# Patient Record
Sex: Female | Born: 2000 | Hispanic: Yes | Marital: Single | Smoking: Never smoker
Health system: Southern US, Community
[De-identification: ages and names within clinical notes are randomized; demographics above are authoritative.]

## PROBLEM LIST (undated history)

## (undated) DIAGNOSIS — M419 Scoliosis, unspecified: Secondary | ICD-10-CM

## (undated) DIAGNOSIS — L03317 Cellulitis of buttock: Secondary | ICD-10-CM

## (undated) DIAGNOSIS — O24419 Gestational diabetes mellitus in pregnancy, unspecified control: Secondary | ICD-10-CM

## (undated) DIAGNOSIS — B20 Human immunodeficiency virus [HIV] disease: Secondary | ICD-10-CM

## (undated) DIAGNOSIS — Z8614 Personal history of Methicillin resistant Staphylococcus aureus infection: Secondary | ICD-10-CM

## (undated) DIAGNOSIS — F419 Anxiety disorder, unspecified: Secondary | ICD-10-CM

## (undated) DIAGNOSIS — Z21 Asymptomatic human immunodeficiency virus [HIV] infection status: Secondary | ICD-10-CM

## (undated) DIAGNOSIS — D249 Benign neoplasm of unspecified breast: Secondary | ICD-10-CM

## (undated) DIAGNOSIS — F32A Depression, unspecified: Secondary | ICD-10-CM

## (undated) DIAGNOSIS — L0231 Cutaneous abscess of buttock: Secondary | ICD-10-CM

## (undated) DIAGNOSIS — L02419 Cutaneous abscess of limb, unspecified: Secondary | ICD-10-CM

## (undated) DIAGNOSIS — L0211 Cutaneous abscess of neck: Secondary | ICD-10-CM

## (undated) HISTORY — DX: Depression, unspecified: F32.A

## (undated) HISTORY — DX: Cellulitis of buttock: L02.31

## (undated) HISTORY — DX: Cutaneous abscess of buttock: L02.31

## (undated) HISTORY — DX: Cutaneous abscess of limb, unspecified: L02.419

## (undated) HISTORY — DX: Personal history of Methicillin resistant Staphylococcus aureus infection: Z86.14

## (undated) HISTORY — DX: Asymptomatic human immunodeficiency virus (hiv) infection status: Z21

## (undated) HISTORY — DX: Cutaneous abscess of neck: L02.11

## (undated) HISTORY — PX: INCISION AND DRAINAGE ABSCESS: SHX5864

## (undated) HISTORY — DX: Scoliosis, unspecified: M41.9

## (undated) HISTORY — DX: Human immunodeficiency virus (HIV) disease: B20

## (undated) HISTORY — DX: Gestational diabetes mellitus in pregnancy, unspecified control: O24.419

## (undated) HISTORY — DX: Cellulitis of buttock: L03.317

## (undated) HISTORY — DX: Benign neoplasm of unspecified breast: D24.9

## (undated) HISTORY — DX: Anxiety disorder, unspecified: F41.9

---
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
## 2019-01-17 ENCOUNTER — Other Ambulatory Visit: Payer: Self-pay

## 2019-01-17 DIAGNOSIS — Z20822 Contact with and (suspected) exposure to covid-19: Secondary | ICD-10-CM

## 2019-01-18 LAB — NOVEL CORONAVIRUS, NAA: SARS-CoV-2, NAA: DETECTED — AB

## 2019-01-24 ENCOUNTER — Other Ambulatory Visit: Payer: Self-pay

## 2019-01-24 DIAGNOSIS — Z20822 Contact with and (suspected) exposure to covid-19: Secondary | ICD-10-CM

## 2019-01-26 LAB — NOVEL CORONAVIRUS, NAA: SARS-CoV-2, NAA: DETECTED — AB

## 2019-07-10 ENCOUNTER — Emergency Department
Admission: EM | Admit: 2019-07-10 | Discharge: 2019-07-10 | Disposition: A | Discharge status: Home / Self Care | Payer: Medicaid Other | Attending: Emergency Medicine | Admitting: Emergency Medicine

## 2019-07-10 ENCOUNTER — Other Ambulatory Visit: Payer: Self-pay

## 2019-07-10 DIAGNOSIS — R519 Headache, unspecified: Secondary | ICD-10-CM | POA: Insufficient documentation

## 2019-07-10 DIAGNOSIS — Z20822 Contact with and (suspected) exposure to covid-19: Secondary | ICD-10-CM | POA: Insufficient documentation

## 2019-07-10 DIAGNOSIS — R1110 Vomiting, unspecified: Secondary | ICD-10-CM | POA: Diagnosis not present

## 2019-07-10 LAB — COMPREHENSIVE METABOLIC PANEL
ALT: 26 U/L (ref 0–44)
AST: 21 U/L (ref 15–41)
Albumin: 4.5 g/dL (ref 3.5–5.0)
Alkaline Phosphatase: 68 U/L (ref 38–126)
Anion gap: 10 (ref 5–15)
BUN: 9 mg/dL (ref 6–20)
CO2: 23 mmol/L (ref 22–32)
Calcium: 8.9 mg/dL (ref 8.9–10.3)
Chloride: 106 mmol/L (ref 98–111)
Creatinine, Ser: 0.69 mg/dL (ref 0.44–1.00)
GFR calc Af Amer: >60 mL/min (ref >60)
GFR calc non Af Amer: >60 mL/min (ref >60)
Glucose, Bld: 103 mg/dL — ABNORMAL HIGH (ref 70–99)
Potassium: 4.1 mmol/L (ref 3.5–5.1)
Sodium: 139 mmol/L (ref 135–145)
Total Bilirubin: 0.7 mg/dL (ref 0.3–1.2)
Total Protein: 8.2 g/dL — ABNORMAL HIGH (ref 6.5–8.1)

## 2019-07-10 LAB — URINALYSIS, COMPLETE (UACMP) WITH MICROSCOPIC
Bacteria, UA: NONE SEEN
Bilirubin Urine: NEGATIVE
Glucose, UA: NEGATIVE mg/dL
Ketones, ur: NEGATIVE mg/dL
Nitrite: NEGATIVE
Protein, ur: 30 mg/dL — AB
Specific Gravity, Urine: 1.021 (ref 1.005–1.030)
pH: 6 (ref 5.0–8.0)

## 2019-07-10 LAB — CBC
HCT: 41.6 % (ref 36.0–46.0)
Hemoglobin: 14.2 g/dL (ref 12.0–15.0)
MCH: 27.4 pg (ref 26.0–34.0)
MCHC: 34.1 g/dL (ref 30.0–36.0)
MCV: 80.3 fL (ref 80.0–100.0)
Platelets: 184 10*3/uL (ref 150–400)
RBC: 5.18 MIL/uL — ABNORMAL HIGH (ref 3.87–5.11)
RDW: 13.3 % (ref 11.5–15.5)
WBC: 5.5 10*3/uL (ref 4.0–10.5)
nRBC: 0 % (ref 0.0–0.2)

## 2019-07-10 LAB — POCT PREGNANCY, URINE: Preg Test, Ur: NEGATIVE

## 2019-07-10 LAB — LIPASE, BLOOD: Lipase: 28 U/L (ref 11–51)

## 2019-07-10 MED ORDER — KETOROLAC TROMETHAMINE 30 MG/ML IJ SOLN
15.0000 mg | Freq: Once | INTRAMUSCULAR | Status: AC
  Administered 2019-07-10: 15 mg via INTRAVENOUS
  Filled 2019-07-10: qty 1

## 2019-07-10 MED ORDER — DIPHENHYDRAMINE HCL 50 MG/ML IJ SOLN
12.5000 mg | Freq: Once | INTRAMUSCULAR | Status: AC
  Administered 2019-07-10: 12.5 mg via INTRAVENOUS
  Filled 2019-07-10: qty 1

## 2019-07-10 MED ORDER — METOCLOPRAMIDE HCL 5 MG/ML IJ SOLN
10.0000 mg | Freq: Once | INTRAMUSCULAR | Status: AC
Start: 2019-07-10 — End: 2019-07-10
  Administered 2019-07-10: 10 mg via INTRAVENOUS
  Filled 2019-07-10: qty 2

## 2019-07-10 MED ORDER — BUTALBITAL-APAP-CAFFEINE 50-325-40 MG PO TABS: ORAL_TABLET | ORAL | 0 refills | Status: AC

## 2019-07-10 MED ORDER — SODIUM CHLORIDE 0.9 % IV BOLUS
1000.0000 mL | Freq: Once | INTRAVENOUS | Status: AC
  Administered 2019-07-10: 1000 mL via INTRAVENOUS

## 2019-07-10 NOTE — ED Triage Notes (Signed)
Pt comes POV with chills, vomiting, and headache for about 2 weeks.

## 2019-07-10 NOTE — ED Provider Notes (Signed)
[facility] Emergency Department Provider Note ____________________________________________   First MD Initiated Contact with Patient 07/10/19 1233     (approximate)  I have reviewed the triage vital signs and the nursing notes.   HISTORY  Chief Complaint Headache and Emesis  HPI Shelley Nichols is a 19 y.o. female who presents to the emergency department for treatment and evaluation of chills, vomiting, and headache x 2 weeks. No relief with tylenol. She has had 2 episodes of vomiting due to pain.         Past Medical History:  Diagnosis Date  . Abscess of leg   . Abscess of neck   . Cellulitis and abscess of buttock   . Fibroadenoma of breast   . History of MRSA infection   . HIV infection ([place])    diagnosed at age 94-3  . Scoliosis     Patient Active Problem List   Diagnosis Date Noted  . Scoliosis   . Cellulitis and abscess of buttock   . Breast mass, left 10/31/2014  . Fibroadenoma of breast 04/05/2014  . HIV disease ([place]) 04/05/2014  . Cellulitis and abscess 07/17/2009    Past Surgical History:  Procedure Laterality Date  . INCISION AND DRAINAGE ABSCESS  2012?   neck and leg - unsure of dates    Prior to Admission medications   Medication Sig Start Date End Date Taking? Authorizing Provider  butalbital-acetaminophen-caffeine (FIORICET) 50-325-40 MG tablet 1-2 tablets every 6 hours as needed for headache 07/10/19   Sherrie George B, FNP    Allergies Patient has no known allergies.  Family History  Adopted: Yes  Problem Relation Age of Onset  . HIV/AIDS Mother   . HIV/AIDS Father     Social History Social History   Tobacco Use  . Smoking status: Never Smoker  . Smokeless tobacco: Never Used  Substance Use Topics  . Alcohol use: No  . Drug use: No    Review of Systems  Constitutional: No fever/chills Eyes: No visual changes. ENT: No sore throat. Cardiovascular: Denies chest pain. Respiratory: Denies  shortness of breath. Gastrointestinal: No abdominal pain.  No nausea, no vomiting.  No diarrhea.  No constipation. Genitourinary: Negative for dysuria. Musculoskeletal: Negative for back pain. Skin: Negative for rash. Neurological: Negative for headaches, focal weakness or numbness. ____________________________________________   PHYSICAL EXAM:  VITAL SIGNS: ED Triage Vitals  Enc Vitals Group     BP 07/10/19 1127 136/86     Pulse Rate 07/10/19 1127 (!) 117     Resp 07/10/19 1127 18     Temp 07/10/19 1127 99.8 F (37.7 C)     Temp Source 07/10/19 1127 Oral     SpO2 07/10/19 1127 100 %     Weight 07/10/19 1143 150 lb (68 kg)     Height 07/10/19 1143 4\' 11"  (1.499 m)     Head Circumference --      Peak Flow --      Pain Score 07/10/19 1142 10     Pain Loc --      Pain Edu? --      Excl. in [place]? --     Constitutional: Alert and oriented. Well appearing and in no acute distress. Eyes: Conjunctivae are normal. PERRL. EOMI. Head: Atraumatic. Nose: No congestion/rhinnorhea. Mouth/Throat: Mucous membranes are moist.  Oropharynx non-erythematous. Neck: No stridor.   Hematological/Lymphatic/Immunilogical: No cervical lymphadenopathy. Cardiovascular: Normal rate, regular rhythm. Grossly normal heart sounds.  Good peripheral circulation. Respiratory: Normal respiratory effort.  No retractions. Lungs CTAB. Gastrointestinal: Soft and nontender. No distention. No abdominal bruits. No CVA tenderness. Genitourinary:  Musculoskeletal: No lower extremity tenderness nor edema.  No joint effusions. Neurologic:  Normal speech and language. No gross focal neurologic deficits are appreciated. No gait instability. Skin:  Skin is warm, dry and intact. No rash noted. Psychiatric: Mood and affect are normal. Speech and behavior are normal.  ____________________________________________   LABS (all labs ordered are listed, but only abnormal results are displayed)  Labs Reviewed  COMPREHENSIVE  METABOLIC PANEL - Abnormal; Notable for the following components:      Result Value   Glucose, Bld 103 (*)    Total Protein 8.2 (*)    All other components within normal limits  CBC - Abnormal; Notable for the following components:   RBC 5.18 (*)    All other components within normal limits  URINALYSIS, COMPLETE (UACMP) WITH MICROSCOPIC - Abnormal; Notable for the following components:   Color, Urine YELLOW (*)    APPearance HAZY (*)    Hgb urine dipstick MODERATE (*)    Protein, ur 30 (*)    Leukocytes,Ua TRACE (*)    All other components within normal limits  SARS CORONAVIRUS 2 (TAT 6-24 HRS)  LIPASE, BLOOD  POC URINE PREG, ED  POCT PREGNANCY, URINE   ____________________________________________  EKG  Not indicated. ____________________________________________  RADIOLOGY  ED MD interpretation:    Not indicated.  I, Sherrie George, personally viewed and evaluated these images (plain radiographs) as part of my medical decision making, as well as reviewing the written report by the radiologist.  Official radiology report(s): No results found.  ____________________________________________   PROCEDURES  Procedure(s) performed (including Critical Care):  Procedures  ____________________________________________   INITIAL IMPRESSION / ASSESSMENT AND PLAN     19 year old female presenting to the emergency department for treatment and evaluation of headache with vomiting.  See HPI for further details.  Exam is overall reassuring.  Plan will be to order headache cocktail and reassess.  At this time, do not feel that is necessary to get a CT scan.  She will be tested for Covid.  Patient is aware the plan and is agreeable.  DIFFERENTIAL DIAGNOSIS  Headache, migraine, COVID-19  ED COURSE  After IV fluids and medications, patient states that her headache was gone.  She will be given a prescription for Fioricet and advised to follow-up with her primary care provider if  her headache returns or if headaches become frequent or recurrent.  She is to return to the emergency department for symptoms of change or worsen if she is unable to see primary care. ____________________________________________   FINAL CLINICAL IMPRESSION(S) / ED DIAGNOSES  Final diagnoses:  Acute nonintractable headache, unspecified headache type     ED Discharge Orders         Ordered    butalbital-acetaminophen-caffeine (FIORICET) 50-325-40 MG tablet     07/10/19 1411           Shelley Nichols was evaluated in Emergency Department on 07/10/2019 for the symptoms described in the history of present illness. She was evaluated in the context of the global COVID-19 pandemic, which necessitated consideration that the patient might be at risk for infection with the SARS-CoV-2 virus that causes COVID-19. Institutional protocols and algorithms that pertain to the evaluation of patients at risk for COVID-19 are in a state of rapid change based on information released by regulatory bodies including the CDC and federal and state organizations. These policies and  algorithms were followed during the patient's care in the ED.   Note:  This document was prepared using Dragon voice recognition software and may include unintentional dictation errors.   Victorino Dike, FNP 07/10/19 1444    Blake Divine, MD 07/11/19 2048

## 2019-07-10 NOTE — Discharge Instructions (Signed)
Please follow-up with your primary care provider if your headache returns or if you begin to have persistent and frequent headaches.  Your Covid test is pending.  Should be back within the next day or so.  Do not return to work until you have a negative test.  If your test is positive, you will need to remain out of work for 10 days.  Return to the emergency department for symptoms of change or worsen if you are unable to see your primary care provider.

## 2019-07-11 LAB — SARS CORONAVIRUS 2 (TAT 6-24 HRS): SARS Coronavirus 2: NEGATIVE

## 2020-03-11 ENCOUNTER — Encounter: Payer: Self-pay | Admitting: Emergency Medicine

## 2020-03-11 ENCOUNTER — Emergency Department
Admission: EM | Admit: 2020-03-11 | Discharge: 2020-03-11 | Disposition: A | Discharge status: Home / Self Care | Payer: Medicaid Other | Attending: Emergency Medicine | Admitting: Emergency Medicine

## 2020-03-11 ENCOUNTER — Other Ambulatory Visit: Payer: Self-pay

## 2020-03-11 DIAGNOSIS — Z5321 Procedure and treatment not carried out due to patient leaving prior to being seen by health care provider: Secondary | ICD-10-CM | POA: Insufficient documentation

## 2020-03-11 DIAGNOSIS — Z202 Contact with and (suspected) exposure to infections with a predominantly sexual mode of transmission: Secondary | ICD-10-CM | POA: Insufficient documentation

## 2020-03-11 DIAGNOSIS — K920 Hematemesis: Secondary | ICD-10-CM | POA: Diagnosis not present

## 2020-03-11 LAB — COMPREHENSIVE METABOLIC PANEL
ALT: 48 U/L — ABNORMAL HIGH (ref 0–44)
AST: 40 U/L (ref 15–41)
Albumin: 4.5 g/dL (ref 3.5–5.0)
Alkaline Phosphatase: 64 U/L (ref 38–126)
Anion gap: 12 (ref 5–15)
BUN: 12 mg/dL (ref 6–20)
CO2: 24 mmol/L (ref 22–32)
Calcium: 9.3 mg/dL (ref 8.9–10.3)
Chloride: 104 mmol/L (ref 98–111)
Creatinine, Ser: 0.6 mg/dL (ref 0.44–1.00)
GFR, Estimated: >60 mL/min (ref >60)
Glucose, Bld: 103 mg/dL — ABNORMAL HIGH (ref 70–99)
Potassium: 3.7 mmol/L (ref 3.5–5.1)
Sodium: 140 mmol/L (ref 135–145)
Total Bilirubin: 0.5 mg/dL (ref 0.3–1.2)
Total Protein: 8.6 g/dL — ABNORMAL HIGH (ref 6.5–8.1)

## 2020-03-11 LAB — LIPASE, BLOOD: Lipase: 26 U/L (ref 11–51)

## 2020-03-11 LAB — CBC
HCT: 35.3 % — ABNORMAL LOW (ref 36.0–46.0)
Hemoglobin: 11.5 g/dL — ABNORMAL LOW (ref 12.0–15.0)
MCH: 24.7 pg — ABNORMAL LOW (ref 26.0–34.0)
MCHC: 32.6 g/dL (ref 30.0–36.0)
MCV: 75.9 fL — ABNORMAL LOW (ref 80.0–100.0)
Platelets: 243 10*3/uL (ref 150–400)
RBC: 4.65 MIL/uL (ref 3.87–5.11)
RDW: 14.6 % (ref 11.5–15.5)
WBC: 6.1 10*3/uL (ref 4.0–10.5)
nRBC: 0 % (ref 0.0–0.2)

## 2020-03-11 NOTE — ED Triage Notes (Addendum)
Pt to ED via POV with c/o emesis since this morning, pt states noted some blood in her vomit, states has been unable to keep anything down since this morning.   Pt also requesting STD check, states was recently cheated on by partner and would like to be checked.

## 2020-03-23 ENCOUNTER — Emergency Department
Admission: EM | Admit: 2020-03-23 | Discharge: 2020-03-23 | Disposition: A | Discharge status: Home / Self Care | Payer: Medicaid Other | Attending: Emergency Medicine | Admitting: Emergency Medicine

## 2020-03-23 ENCOUNTER — Emergency Department: Payer: Medicaid Other

## 2020-03-23 ENCOUNTER — Other Ambulatory Visit: Payer: Self-pay

## 2020-03-23 DIAGNOSIS — U071 COVID-19: Secondary | ICD-10-CM | POA: Diagnosis not present

## 2020-03-23 DIAGNOSIS — R509 Fever, unspecified: Secondary | ICD-10-CM | POA: Diagnosis present

## 2020-03-23 DIAGNOSIS — Z20822 Contact with and (suspected) exposure to covid-19: Secondary | ICD-10-CM

## 2020-03-23 DIAGNOSIS — Z21 Asymptomatic human immunodeficiency virus [HIV] infection status: Secondary | ICD-10-CM | POA: Insufficient documentation

## 2020-03-23 DIAGNOSIS — R112 Nausea with vomiting, unspecified: Secondary | ICD-10-CM | POA: Diagnosis not present

## 2020-03-23 DIAGNOSIS — R059 Cough, unspecified: Secondary | ICD-10-CM

## 2020-03-23 LAB — POC URINE PREG, ED: Preg Test, Ur: NEGATIVE

## 2020-03-23 LAB — URINALYSIS, COMPLETE (UACMP) WITH MICROSCOPIC
Bacteria, UA: NONE SEEN
Bilirubin Urine: NEGATIVE
Glucose, UA: NEGATIVE mg/dL
Hgb urine dipstick: NEGATIVE
Ketones, ur: NEGATIVE mg/dL
Leukocytes,Ua: NEGATIVE
Nitrite: NEGATIVE
Protein, ur: NEGATIVE mg/dL
Specific Gravity, Urine: 1.019 (ref 1.005–1.030)
pH: 7 (ref 5.0–8.0)

## 2020-03-23 LAB — CBC WITH DIFFERENTIAL/PLATELET
Abs Immature Granulocytes: 0.02 10*3/uL (ref 0.00–0.07)
Basophils Absolute: 0 10*3/uL (ref 0.0–0.1)
Basophils Relative: 0 %
Eosinophils Absolute: 0.8 10*3/uL — ABNORMAL HIGH (ref 0.0–0.5)
Eosinophils Relative: 14 %
HCT: 34.6 % — ABNORMAL LOW (ref 36.0–46.0)
Hemoglobin: 10.7 g/dL — ABNORMAL LOW (ref 12.0–15.0)
Immature Granulocytes: 0 %
Lymphocytes Relative: 31 %
Lymphs Abs: 1.9 10*3/uL (ref 0.7–4.0)
MCH: 24 pg — ABNORMAL LOW (ref 26.0–34.0)
MCHC: 30.9 g/dL (ref 30.0–36.0)
MCV: 77.8 fL — ABNORMAL LOW (ref 80.0–100.0)
Monocytes Absolute: 0.5 10*3/uL (ref 0.1–1.0)
Monocytes Relative: 7 %
Neutro Abs: 2.9 10*3/uL (ref 1.7–7.7)
Neutrophils Relative %: 48 %
Platelets: 234 10*3/uL (ref 150–400)
RBC: 4.45 MIL/uL (ref 3.87–5.11)
RDW: 14.4 % (ref 11.5–15.5)
WBC: 6.1 10*3/uL (ref 4.0–10.5)
nRBC: 0 % (ref 0.0–0.2)

## 2020-03-23 LAB — COMPREHENSIVE METABOLIC PANEL
ALT: 30 U/L (ref 0–44)
AST: 34 U/L (ref 15–41)
Albumin: 4.4 g/dL (ref 3.5–5.0)
Alkaline Phosphatase: 75 U/L (ref 38–126)
Anion gap: 9 (ref 5–15)
BUN: 11 mg/dL (ref 6–20)
CO2: 23 mmol/L (ref 22–32)
Calcium: 9 mg/dL (ref 8.9–10.3)
Chloride: 105 mmol/L (ref 98–111)
Creatinine, Ser: 0.67 mg/dL (ref 0.44–1.00)
GFR, Estimated: >60 mL/min (ref >60)
Glucose, Bld: 89 mg/dL (ref 70–99)
Potassium: 3.7 mmol/L (ref 3.5–5.1)
Sodium: 137 mmol/L (ref 135–145)
Total Bilirubin: 0.5 mg/dL (ref 0.3–1.2)
Total Protein: 8.3 g/dL — ABNORMAL HIGH (ref 6.5–8.1)

## 2020-03-23 LAB — RESP PANEL BY RT-PCR (FLU A&B, COVID) ARPGX2
Influenza A by PCR: NEGATIVE
Influenza B by PCR: NEGATIVE
SARS Coronavirus 2 by RT PCR: POSITIVE — AB

## 2020-03-23 LAB — LACTIC ACID, PLASMA: Lactic Acid, Venous: 1.2 mmol/L (ref 0.5–1.9)

## 2020-03-23 LAB — POC SARS CORONAVIRUS 2 AG -  ED: SARS Coronavirus 2 Ag: NEGATIVE

## 2020-03-23 MED ORDER — SODIUM CHLORIDE 0.9 % IV BOLUS
1000.0000 mL | Freq: Once | INTRAVENOUS | Status: AC
  Administered 2020-03-23: 1000 mL via INTRAVENOUS

## 2020-03-23 MED ORDER — ONDANSETRON 4 MG PO TBDP
4.0000 mg | ORAL_TABLET | Freq: Once | ORAL | Status: AC
  Administered 2020-03-23: 4 mg via ORAL
  Filled 2020-03-23: qty 1

## 2020-03-23 NOTE — ED Notes (Signed)
This RN received a call from lab regarding positive covid test result. Pt chart accessed to find out where pt was located in the ED. Flex RN notified to inform PA of result

## 2020-03-23 NOTE — ED Triage Notes (Signed)
Pt to ED POV for chief complaint of fever mx of 101 that started last night with cough Afebrile in triage

## 2020-03-23 NOTE — ED Provider Notes (Signed)
Excepting patient from Shelley Neri, PA-C.  Patient is HIV positive and has been having a fever for a few days.  Rapid Covid test was negative.  Plan of care at this time is to await chest x-ray and labs.  Patient does appear to be stable at this time.  Vitals are normal.   Physical Exam  BP 140/78 (BP Location: Left Arm)   Pulse 98   Temp 98.5 F (36.9 C) (Oral)   Resp 18   Ht 4\' 11"  (1.499 m)   Wt 63 kg   LMP 03/05/2020 (Exact Date)   SpO2 100%   BMI 28.05 kg/m   Physical Exam  ED Course/Procedures   Clinical Course as of 03/23/20 2159  Fri Mar 23, 2020  2139 Resp Panel by RT-PCR (Flu A&B, Covid) Nasopharyngeal Swab [SF]    Clinical Course User Index [SF] Shelley Section Linden Dolin, PA-C    Procedures  MDM  Patient's labs and chest x-ray were normal. 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 She left a cell phone number to be able to call her with her Covid result.  Covid test resulted as positive. I did call patient to let her know she needs to quarantine for 10 days as she is not vaccinated. I will send a referral through to the infusion clinic. With her being unvaccinated and of Hispanic consent along with HIV positive I feel that she qualifies for monoclonal antibody infusions. She states she understands. She is going to return to the emergency department if worsening.       Shelley Starks, PA-C 03/23/20 2201    Shelley Plummer, MD 03/23/20 [phone]

## 2020-03-23 NOTE — ED Notes (Addendum)
Pt c/o fever since last night and 10+ episodes of emesis. Pt denies SOB, cough, headache, body aches. Pt AOX4, lung sounds clear. Pt requests a work note

## 2020-03-23 NOTE — ED Notes (Signed)
POC covid test in progress.

## 2020-03-23 NOTE — Discharge Instructions (Addendum)

## 2020-03-23 NOTE — ED Provider Notes (Signed)
[facility] Emergency Department Provider Note   ____________________________________________   Event Date/Time   First MD Initiated Contact with Patient 03/23/20 1742     (approximate)  I have reviewed the triage vital signs and the nursing notes.   HISTORY  Chief Complaint Fever   HPI Shelley Nichols is a 20 y.o. female presents to the ED with complaint of fever of 101 last evening with onset of cough that came on suddenly.  Patient denies any any urinary symptoms.  She has had some nausea with vomiting.  She denies any diarrhea.  Patient is unvaccinated and reports that she did have Covid last year however the test seen in her chart for 07/10/2019 is negative.  She is unaware of any known exposure.  Patient states that she was diagnosed with HIV at the age of 40 or 20 years old.  She is currently seen at [facility] and is on medication.  Her last visit was 1 year ago and she reports that her lab work was acceptable with her doctors at [facility].       Past Medical History:  Diagnosis Date  . Abscess of leg   . Abscess of neck   . Cellulitis and abscess of buttock   . Fibroadenoma of breast   . History of MRSA infection   . HIV infection ([place])    diagnosed at age 18-3  . Scoliosis     Patient Active Problem List   Diagnosis Date Noted  . Scoliosis   . Cellulitis and abscess of buttock   . Breast mass, left 10/31/2014  . Fibroadenoma of breast 04/05/2014  . HIV disease ([place]) 04/05/2014  . Cellulitis and abscess 07/17/2009    Past Surgical History:  Procedure Laterality Date  . INCISION AND DRAINAGE ABSCESS  2012?   neck and leg - unsure of dates    Prior to Admission medications   Medication Sig Start Date End Date Taking? Authorizing Provider  butalbital-acetaminophen-caffeine (FIORICET) 50-325-40 MG tablet 1-2 tablets every 6 hours as needed for headache 07/10/19   Sherrie George B, FNP    Allergies Patient has no known  allergies.  Family History  Adopted: Yes  Problem Relation Age of Onset  . HIV/AIDS Mother   . HIV/AIDS Father     Social History Social History   Tobacco Use  . Smoking status: Never Smoker  . Smokeless tobacco: Never Used  Vaping Use  . Vaping Use: Never used  Substance Use Topics  . Alcohol use: No  . Drug use: No    Review of Systems Constitutional: Positive fever/chills Eyes: No visual changes. ENT: No sore throat. Cardiovascular: Denies chest pain. Respiratory: Denies shortness of breath.  Positive for cough. Gastrointestinal: No abdominal pain.  Positive nausea, positive  vomiting.  No diarrhea.   Genitourinary: Negative for dysuria. Musculoskeletal: Negative for muscle skeletal pain. Skin: Negative for rash. Neurological: Negative for headaches, focal weakness or numbness. Allergic/Immunilogical:   Patient is HIV positive.  ____________________________________________   PHYSICAL EXAM:  VITAL SIGNS: ED Triage Vitals  Enc Vitals Group     BP 03/23/20 1736 140/78     Pulse Rate 03/23/20 1736 98     Resp 03/23/20 1736 18     Temp 03/23/20 1736 98.5 F (36.9 C)     Temp Source 03/23/20 1736 Oral     SpO2 03/23/20 1736 100 %     Weight 03/23/20 1737 138 lb 14.2 oz (63 kg)  Height 03/23/20 1737 4\' 11"  (1.499 m)     Head Circumference --      Peak Flow --      Pain Score 03/23/20 1737 0     Pain Loc --      Pain Edu? --      Excl. in [place]? --     Constitutional: Alert and oriented. Well appearing and in no acute distress.  Patient is cooperative and answers questions appropriately.  She does not appear to be acutely ill. Eyes: Conjunctivae are normal.  Head: Atraumatic. Nose: No congestion/rhinnorhea. Neck: No stridor.   Cardiovascular: Normal rate, regular rhythm. Grossly normal heart sounds.  Good peripheral circulation. Respiratory: Normal respiratory effort.  No retractions. Lungs CTAB. Gastrointestinal: Soft and nontender. No distention.  Bowel  sounds normoactive x4 quadrants. Musculoskeletal: Moves upper and lower extremities with any difficulty.  Patient is ambulatory without any difficulty. Neurologic:  Normal speech and language. No gross focal neurologic deficits are appreciated. No gait instability. Skin:  Skin is warm, dry and intact. No rash noted. Psychiatric: Mood and affect are normal. Speech and behavior are normal.  ____________________________________________   LABS (all labs ordered are listed, but only abnormal results are displayed)  Labs Reviewed  RESP PANEL BY RT-PCR (FLU A&B, COVID) [id number]  CULTURE, BLOOD (ROUTINE X 2)  CULTURE, BLOOD (ROUTINE X 2)  URINALYSIS, COMPLETE (UACMP) WITH MICROSCOPIC  CBC WITH DIFFERENTIAL/PLATELET  COMPREHENSIVE METABOLIC PANEL  LACTIC ACID, PLASMA  LACTIC ACID, PLASMA  POC SARS CORONAVIRUS 2 AG -  ED  POC URINE PREG, ED      PROCEDURES  Procedure(s) performed (including Critical Care):  Procedures   ____________________________________________   INITIAL IMPRESSION / ASSESSMENT AND PLAN / ED COURSE  As part of my medical decision making, I reviewed the following data within the electronic MEDICAL RECORD NUMBER Notes from prior ED visits and [place] Controlled Substance Database  ----------------------------------------- 7:02 PM on 03/23/2020 -----------------------------------------   20 year old female presents to the ED with complaint of fever of 101 last evening and cough that started suddenly.  Patient has had nausea with vomiting.  She denies any urinary symptoms, diarrhea, change in taste or smell.  Patient is unvaccinated and although she states that she did have Covid last year the only test seen in her chart in May was with negative results.  Patient is immunocompromised with HIV and currently she is taking medication and sees the infectious disease at [facility].  Rapid Covid test was negative and patient was made aware that more extensive test would be done  to determine the cause of her fever.  Patient's case was discussed with Ashok Cordia, PA-C who will be taking over the care of Shelley Nichols.  All test results were pending at the time of this dictation. ____________________________________________   FINAL CLINICAL IMPRESSION(S) / ED DIAGNOSES  Final diagnoses:  Cough in adult patient     ED Discharge Orders    None      *Please note:  Shelley Nichols was evaluated in Emergency Department on 03/23/2020 for the symptoms described in the history of present illness. She was evaluated in the context of the global COVID-19 pandemic, which necessitated consideration that the patient might be at risk for infection with the SARS-CoV-2 virus that causes COVID-19. Institutional protocols and algorithms that pertain to the evaluation of patients at risk for COVID-19 are in a state of rapid change based on information released by regulatory bodies including the CDC and federal and  state organizations. These policies and algorithms were followed during the patient's care in the ED.  Some ED evaluations and interventions may be delayed as a result of limited staffing during and the pandemic.*   Note:  This document was prepared using Dragon voice recognition software and may include unintentional dictation errors.    Johnn Hai, PA-C 03/24/20 1107    Merlyn Lot, MD 03/29/20 [phone]

## 2020-03-28 LAB — CULTURE, BLOOD (ROUTINE X 2)
Culture: NO GROWTH
Culture: NO GROWTH
Special Requests: ADEQUATE

## 2020-11-18 IMAGING — DX PORTABLE CHEST - 1 VIEW
1 series · 1 of 1 positions shown · non-contrast
Comparison: 11/28/2010

CLINICAL DATA: Cough and fever

EXAM:
PORTABLE CHEST 1 VIEW

[chest ap]
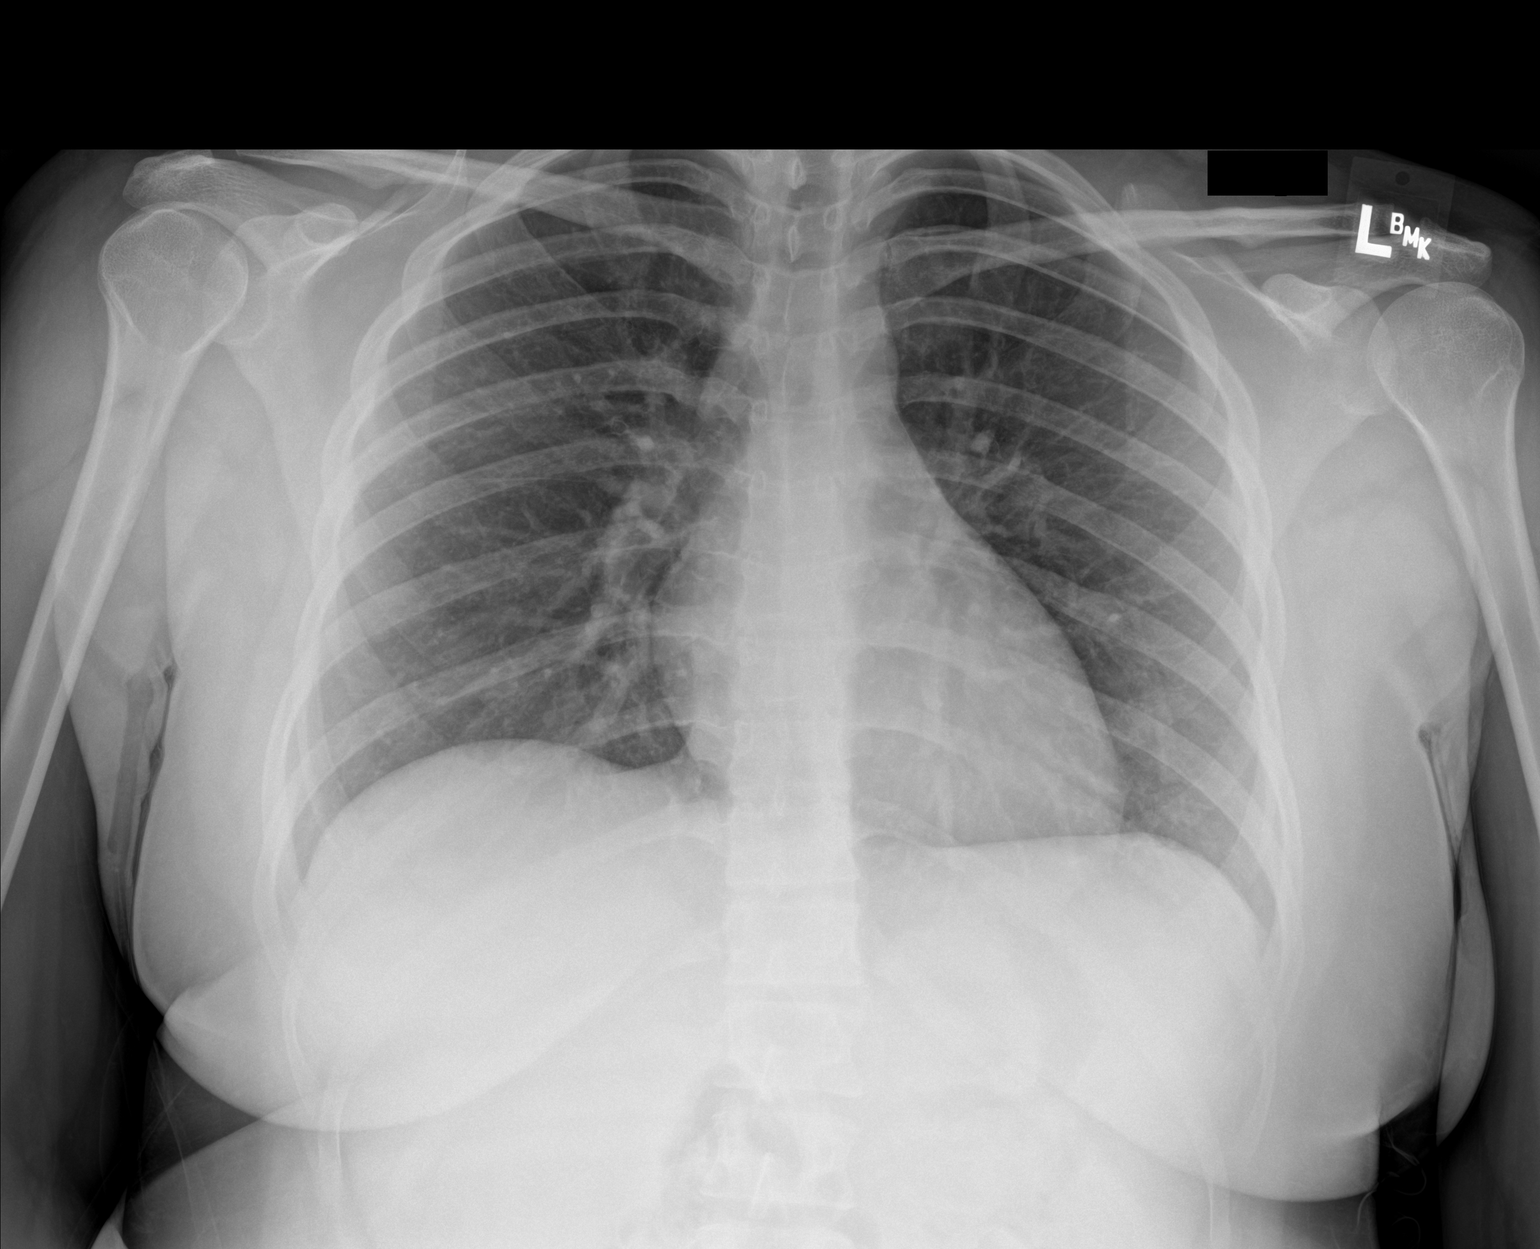

[1 of 1 positions shown; findings below may reference images not displayed]

FINDINGS: The heart size and mediastinal contours are within normal limits.
Both lungs are clear. The visualized skeletal structures are
unremarkable.
IMPRESSION: No active disease.

## 2022-07-08 ENCOUNTER — Emergency Department
Admission: EM | Admit: 2022-07-08 | Discharge: 2022-07-08 | Disposition: A | Discharge status: Home / Self Care | Payer: Medicaid Other | Attending: Emergency Medicine | Admitting: Emergency Medicine

## 2022-07-08 ENCOUNTER — Other Ambulatory Visit: Payer: Self-pay

## 2022-07-08 DIAGNOSIS — O219 Vomiting of pregnancy, unspecified: Secondary | ICD-10-CM | POA: Insufficient documentation

## 2022-07-08 DIAGNOSIS — Z3A17 17 weeks gestation of pregnancy: Secondary | ICD-10-CM | POA: Insufficient documentation

## 2022-07-08 DIAGNOSIS — B9789 Other viral agents as the cause of diseases classified elsewhere: Secondary | ICD-10-CM | POA: Insufficient documentation

## 2022-07-08 DIAGNOSIS — J069 Acute upper respiratory infection, unspecified: Secondary | ICD-10-CM | POA: Insufficient documentation

## 2022-07-08 DIAGNOSIS — Z20822 Contact with and (suspected) exposure to covid-19: Secondary | ICD-10-CM | POA: Insufficient documentation

## 2022-07-08 DIAGNOSIS — O99512 Diseases of the respiratory system complicating pregnancy, second trimester: Secondary | ICD-10-CM | POA: Diagnosis not present

## 2022-07-08 LAB — SARS CORONAVIRUS 2 BY RT PCR: SARS Coronavirus 2 by RT PCR: NEGATIVE

## 2022-07-08 LAB — URINALYSIS, ROUTINE W REFLEX MICROSCOPIC
Bilirubin Urine: NEGATIVE
Glucose, UA: NEGATIVE mg/dL
Hgb urine dipstick: NEGATIVE
Ketones, ur: 20 mg/dL — AB
Leukocytes,Ua: NEGATIVE
Nitrite: NEGATIVE
Protein, ur: NEGATIVE mg/dL
Specific Gravity, Urine: 1.018 (ref 1.005–1.030)
pH: 6 (ref 5.0–8.0)

## 2022-07-08 LAB — GROUP A STREP BY PCR: Group A Strep by PCR: NOT DETECTED

## 2022-07-08 MED ORDER — PROMETHAZINE HCL 12.5 MG PO TABS: 12.5000 mg | ORAL_TABLET | Freq: Four times a day (QID) | ORAL | 0 refills | Status: AC | PRN

## 2022-07-08 NOTE — Discharge Instructions (Addendum)
You can take plain Mucinex (guaifenesin) as directed on the packaging.  You can also take Zyrtec (cetirizine) daily as directed on the packaging.  If you take the phenergan, be aware it may cause drowsiness. If taking it, driving and taking care of other children alone is not recommended.

## 2022-07-08 NOTE — ED Notes (Signed)
Pt has strong dry cough; sounds somewhat hoarse.

## 2022-07-08 NOTE — ED Triage Notes (Signed)
Pt to ED for cough, sore throat for 2 weeks. Pt [redacted] weeks pregnant.

## 2022-07-08 NOTE — ED Notes (Signed)
Called lab as out of supplies. Will scab pt once lab send more covid swab kits.

## 2022-07-08 NOTE — ED Notes (Signed)
Called lab to run covid and strep; staff states they will momentarily.

## 2022-07-08 NOTE — ED Notes (Signed)
Provider CBT at bedside.  ?

## 2022-07-08 NOTE — ED Provider Notes (Signed)
[facility] Provider Note    Event Date/Time   First MD Initiated Contact with Patient 07/08/22 1330     (approximate)   History   Sore Throat   HPI  Shelley Nichols is a 22 y.o. female  [redacted] weeks pregnant and as listed in EMR presents to the emergency department for treatment and evaluation of vomiting, cough, and sore throat. Symptoms started 3 weeks ago. Vomiting daily, sometimes more than once. She has been prescribed zofran by OB, but she doesn't think it works. Cough is occasionally productive. No fever.  No abdominal cramping, vaginal bleeding or discharge.      Physical Exam   Triage Vital Signs: ED Triage Vitals  Enc Vitals Group     BP 07/08/22 1308 128/83     Pulse Rate 07/08/22 1308 98     Resp 07/08/22 1308 18     Temp 07/08/22 1308 97.8 F (36.6 C)     Temp src --      SpO2 07/08/22 1308 95 %     Weight 07/08/22 1309 165 lb (74.8 kg)     Height 07/08/22 1309 4\' 11"  (1.499 m)     Head Circumference --      Peak Flow --      Pain Score 07/08/22 1309 0     Pain Loc --      Pain Edu? --      Excl. in [place]? --     Most recent vital signs: Vitals:   07/08/22 1308  BP: 128/83  Pulse: 98  Resp: 18  Temp: 97.8 F (36.6 C)  SpO2: 95%    General: Awake, no distress. Voice is horse. CV:  Good peripheral perfusion.  Resp:  Normal effort. Breath sounds are clear to auscultation. Abd:  No distention. Gravid. Soft, nontender. Other:  Throat is mildly edematous with cobblestone exudate. No tonsil edema or exudate.   ED Results / Procedures / Treatments   Labs (all labs ordered are listed, but only abnormal results are displayed) Labs Reviewed  URINALYSIS, ROUTINE W REFLEX MICROSCOPIC - Abnormal; Notable for the following components:      Result Value   Color, Urine YELLOW (*)    APPearance HAZY (*)    Ketones, ur 20 (*)    All other components within normal limits  SARS CORONAVIRUS 2 BY RT PCR  GROUP A STREP BY PCR      EKG  Not indicated.   RADIOLOGY  Image and radiology report reviewed and interpreted by me. Radiology report consistent with the same.  Not indicated.   PROCEDURES:  Critical Care performed: No  Procedures   MEDICATIONS ORDERED IN ED:  Medications - No data to display   IMPRESSION / MDM / ASSESSMENT AND PLAN / ED COURSE   I have reviewed the triage note.  Differential diagnosis includes, but is not limited to, viral URI, bronchitis, seasonal allergies, vomiting in pregnancy, dehydration  Patient's presentation is most consistent with acute complicated illness / injury requiring diagnostic workup.  22 year old female presenting to the emergency department for treatment and evaluation of URI symptoms as well as vomiting in pregnancy.  Urinalysis is overall reassuring.  She does have 20 ketones but no protein or indication of infection.  She is tolerating fluids.  Strep screening and COVID screening were submitted while in triage however symptoms have been ongoing for 3 weeks and unlikely to be either. Plan will be to treat her with Zyrtec, guaifenesin, and  Phenergan.  She was made aware that the Phenergan may cause drowsiness and that she should not drive or be the sole caregiver for her other children if she is going to take it.  ER return precautions discussed.      FINAL CLINICAL IMPRESSION(S) / ED DIAGNOSES   Final diagnoses:  Nausea and vomiting in pregnancy  Viral upper respiratory tract infection     Rx / DC Orders   ED Discharge Orders          Ordered    promethazine (PHENERGAN) 12.5 MG tablet  Every 6 hours PRN        07/08/22 1424             Note:  This document was prepared using Dragon voice recognition software and may include unintentional dictation errors.   Chinita Pester, FNP 07/09/22 1400    Pilar Jarvis, MD 07/09/22 [phone]

## 2022-08-09 IMAGING — DX DG CHEST 1V PORT
1 series · 2 of 2 positions shown · non-contrast
Comparison: 07/03/2018

CLINICAL DATA: Cough and fever.  Vomiting.

EXAM:
PORTABLE CHEST 1 VIEW

[Series 1: chest ap · 0.14mm/px · 2 of 2 slices shown]
[im 1/2]
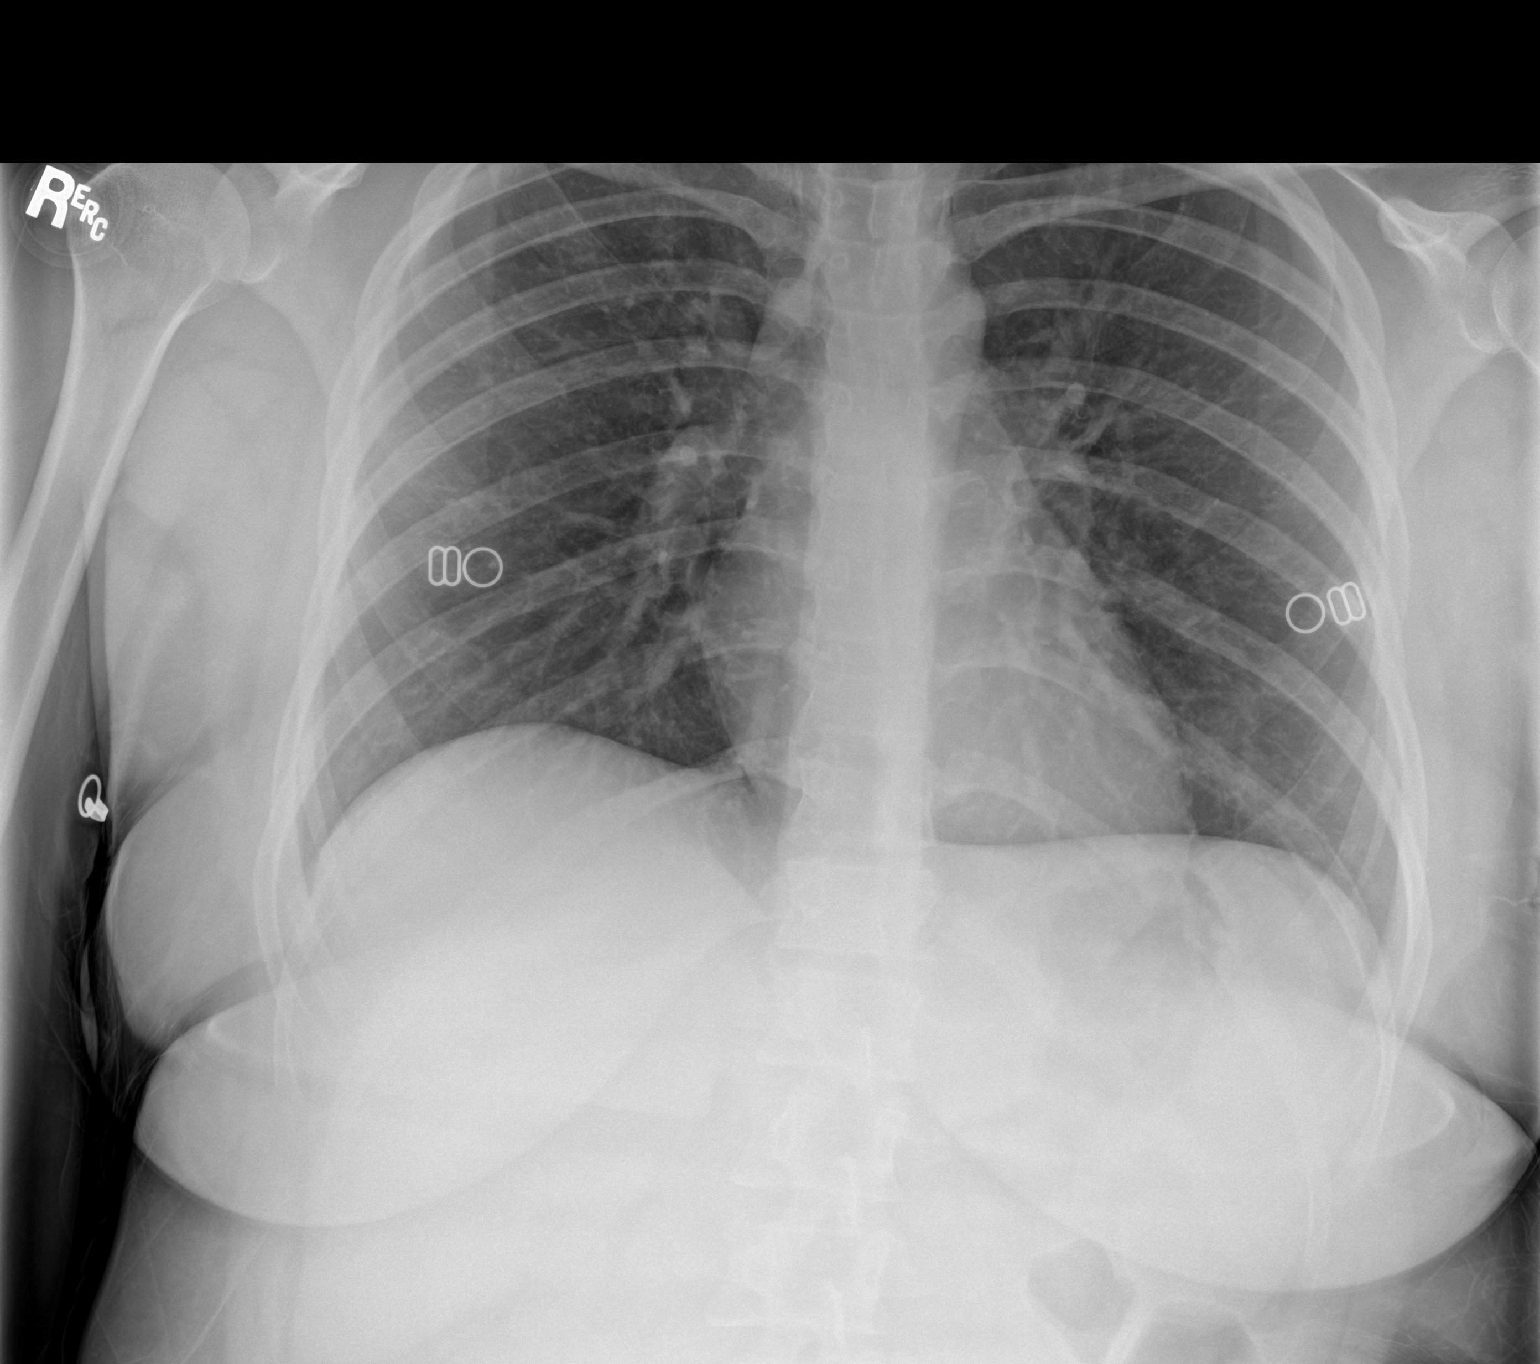
[im 2/2]
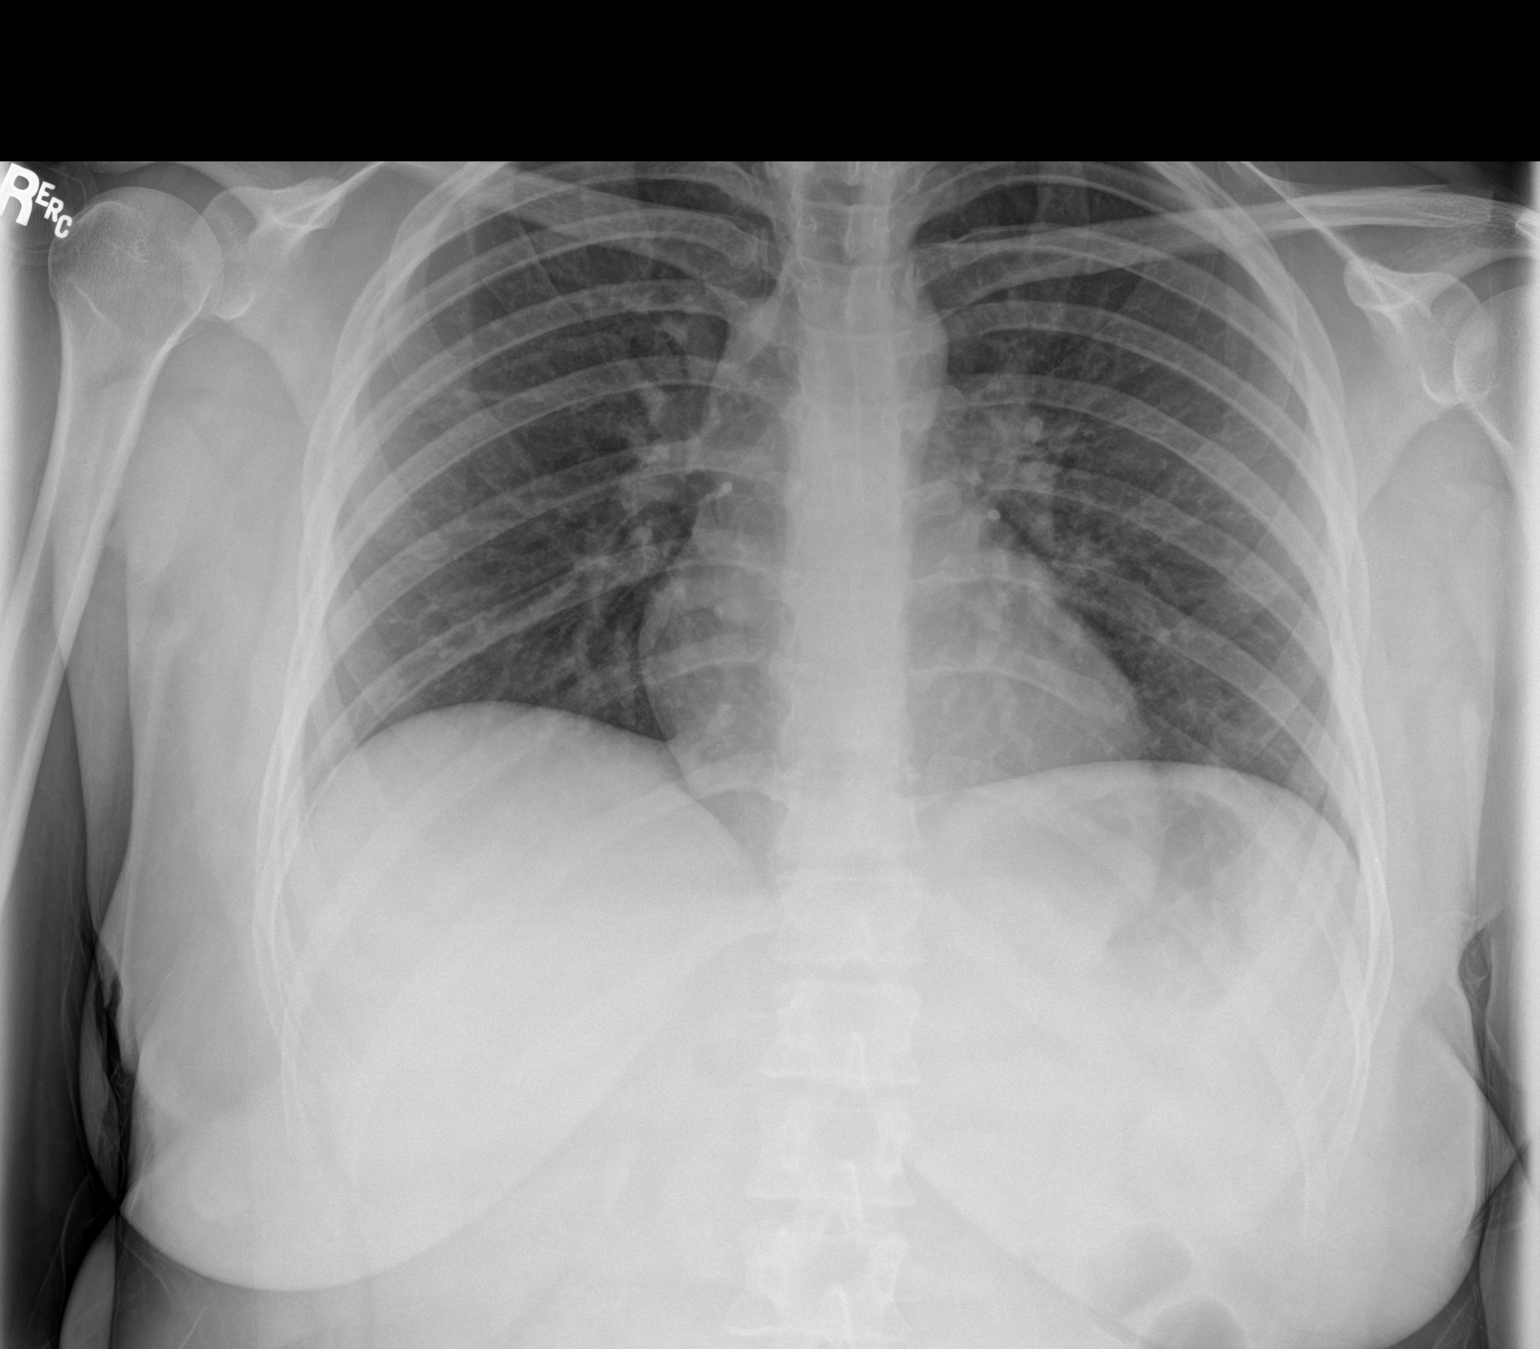

[2 of 2 positions shown; findings below may reference images not displayed]

FINDINGS: Low lung volumes.The cardiomediastinal contours are normal. The
lungs are clear. Pulmonary vasculature is normal. No consolidation,
pleural effusion, or pneumothorax. No acute osseous abnormalities
are seen.
IMPRESSION: Low lung volumes without acute abnormality.

## 2022-10-07 ENCOUNTER — Observation Stay
Admission: EM | Admit: 2022-10-07 | Discharge: 2022-10-07 | Disposition: A | Discharge status: Home / Self Care | Payer: Medicaid Other | Source: Home / Self Care | Attending: Emergency Medicine | Admitting: Emergency Medicine

## 2022-10-07 ENCOUNTER — Encounter: Payer: Self-pay | Admitting: Emergency Medicine

## 2022-10-07 ENCOUNTER — Other Ambulatory Visit: Payer: Self-pay

## 2022-10-07 DIAGNOSIS — O98713 Human immunodeficiency virus [HIV] disease complicating pregnancy, third trimester: Secondary | ICD-10-CM | POA: Diagnosis not present

## 2022-10-07 DIAGNOSIS — N898 Other specified noninflammatory disorders of vagina: Secondary | ICD-10-CM | POA: Diagnosis present

## 2022-10-07 DIAGNOSIS — Z3A29 29 weeks gestation of pregnancy: Secondary | ICD-10-CM | POA: Diagnosis not present

## 2022-10-07 DIAGNOSIS — R42 Dizziness and giddiness: Secondary | ICD-10-CM | POA: Diagnosis not present

## 2022-10-07 DIAGNOSIS — B9689 Other specified bacterial agents as the cause of diseases classified elsewhere: Secondary | ICD-10-CM | POA: Diagnosis not present

## 2022-10-07 DIAGNOSIS — Z21 Asymptomatic human immunodeficiency virus [HIV] infection status: Secondary | ICD-10-CM | POA: Diagnosis not present

## 2022-10-07 DIAGNOSIS — O26899 Other specified pregnancy related conditions, unspecified trimester: Secondary | ICD-10-CM | POA: Diagnosis present

## 2022-10-07 DIAGNOSIS — O23593 Infection of other part of genital tract in pregnancy, third trimester: Principal | ICD-10-CM | POA: Insufficient documentation

## 2022-10-07 DIAGNOSIS — O99891 Other specified diseases and conditions complicating pregnancy: Secondary | ICD-10-CM | POA: Diagnosis present

## 2022-10-07 DIAGNOSIS — O2393 Unspecified genitourinary tract infection in pregnancy, third trimester: Secondary | ICD-10-CM

## 2022-10-07 DIAGNOSIS — B20 Human immunodeficiency virus [HIV] disease: Secondary | ICD-10-CM | POA: Diagnosis not present

## 2022-10-07 DIAGNOSIS — O212 Late vomiting of pregnancy: Secondary | ICD-10-CM | POA: Insufficient documentation

## 2022-10-07 LAB — CBC WITH DIFFERENTIAL/PLATELET
Abs Immature Granulocytes: 0.06 10*3/uL (ref 0.00–0.07)
Basophils Absolute: 0 10*3/uL (ref 0.0–0.1)
Basophils Relative: 0 %
Eosinophils Absolute: 0.1 10*3/uL (ref 0.0–0.5)
Eosinophils Relative: 1 %
HCT: 29.5 % — ABNORMAL LOW (ref 36.0–46.0)
Hemoglobin: 9.6 g/dL — ABNORMAL LOW (ref 12.0–15.0)
Immature Granulocytes: 1 %
Lymphocytes Relative: 23 %
Lymphs Abs: 1.5 10*3/uL (ref 0.7–4.0)
MCH: 25 pg — ABNORMAL LOW (ref 26.0–34.0)
MCHC: 32.5 g/dL (ref 30.0–36.0)
MCV: 76.8 fL — ABNORMAL LOW (ref 80.0–100.0)
Monocytes Absolute: 0.4 10*3/uL (ref 0.1–1.0)
Monocytes Relative: 7 %
Neutro Abs: 4.3 10*3/uL (ref 1.7–7.7)
Neutrophils Relative %: 68 %
Platelets: 207 10*3/uL (ref 150–400)
RBC: 3.84 MIL/uL — ABNORMAL LOW (ref 3.87–5.11)
RDW: 14 % (ref 11.5–15.5)
WBC: 6.3 10*3/uL (ref 4.0–10.5)
nRBC: 0 % (ref 0.0–0.2)

## 2022-10-07 LAB — COMPREHENSIVE METABOLIC PANEL
ALT: 15 U/L (ref 0–44)
AST: 26 U/L (ref 15–41)
Albumin: 3.2 g/dL — ABNORMAL LOW (ref 3.5–5.0)
Alkaline Phosphatase: 78 U/L (ref 38–126)
Anion gap: 8 (ref 5–15)
BUN: 7 mg/dL (ref 6–20)
CO2: 20 mmol/L — ABNORMAL LOW (ref 22–32)
Calcium: 8.4 mg/dL — ABNORMAL LOW (ref 8.9–10.3)
Chloride: 106 mmol/L (ref 98–111)
Creatinine, Ser: 0.48 mg/dL (ref 0.44–1.00)
GFR, Estimated: >60 mL/min (ref >60)
Glucose, Bld: 93 mg/dL (ref 70–99)
Potassium: 3.3 mmol/L — ABNORMAL LOW (ref 3.5–5.1)
Sodium: 134 mmol/L — ABNORMAL LOW (ref 135–145)
Total Bilirubin: 0.5 mg/dL (ref 0.3–1.2)
Total Protein: 7 g/dL (ref 6.5–8.1)

## 2022-10-07 LAB — WET PREP, GENITAL
Sperm: NONE SEEN
Trich, Wet Prep: NONE SEEN
WBC, Wet Prep HPF POC: >=10 — AB (ref <10)
Yeast Wet Prep HPF POC: NONE SEEN

## 2022-10-07 LAB — URINALYSIS, ROUTINE W REFLEX MICROSCOPIC
Bilirubin Urine: NEGATIVE
Glucose, UA: NEGATIVE mg/dL
Hgb urine dipstick: NEGATIVE
Ketones, ur: NEGATIVE mg/dL
Nitrite: NEGATIVE
Protein, ur: NEGATIVE mg/dL
Specific Gravity, Urine: 1.015 (ref 1.005–1.030)
pH: 7 (ref 5.0–8.0)

## 2022-10-07 LAB — MAGNESIUM: Magnesium: 1.8 mg/dL (ref 1.7–2.4)

## 2022-10-07 LAB — RUPTURE OF MEMBRANE (ROM)PLUS: Rom Plus: NEGATIVE

## 2022-10-07 LAB — LIPASE, BLOOD: Lipase: 31 U/L (ref 11–51)

## 2022-10-07 MED ORDER — ONDANSETRON HCL 4 MG/2ML IJ SOLN
4.0000 mg | Freq: Once | INTRAMUSCULAR | Status: AC
  Administered 2022-10-07: 4 mg via INTRAVENOUS
  Filled 2022-10-07: qty 2

## 2022-10-07 MED ORDER — LACTATED RINGERS IV BOLUS
1000.0000 mL | Freq: Once | INTRAVENOUS | Status: AC
  Administered 2022-10-07: 1000 mL via INTRAVENOUS

## 2022-10-07 MED ORDER — METRONIDAZOLE 0.75 % VA GEL: 1.0000 | Freq: Every day | VAGINAL | 0 refills | Status: AC

## 2022-10-07 NOTE — OB Triage Note (Addendum)
Patient is a  22 yo, G3P2, at 29 weeks 5 days. Patient presents to L&D after being evaluated and discharged from the ED for N/V and dizziness. Patient reports the N/V and dizziness have resolved. Patient denies any vaginal bleeding but states she may have urinated on herself earlier when she vomited and has been noticing increased discharge since then.  Patient reports +FM. Patient denies any regular or consistent contractions.Monitors applied and assessing. VSS. Initial fetal heart tone 135. Keitha Butte, CNM notified of patients arrival to unit. Plan to place in observation for NST, wet prep, UA, and ROM+.

## 2022-10-07 NOTE — ED Triage Notes (Addendum)
Patient ambulatory to triage with steady gait, without difficulty or distress noted; pt reports awakening with nausea and dizziness; denies pain, denies bleeding or leaking fluid, +fetal movement, denies any hx prenatal concerns or problems; pt [redacted]wks pregnant

## 2022-10-07 NOTE — ED Provider Notes (Signed)
[facility] Provider Note    Event Date/Time   First MD Initiated Contact with Patient 10/07/22 0700     (approximate)   History   Dizziness   HPI  Shelley Nichols is a 22 y.o. female  who presents to the emergency department today because of concern for episode of nausea and dizziness. Patient states she woke up and felt dizzy.  She went to the bathroom and vomited.  She states that she was having a hard time getting up from the floor because of the dizziness.  She denies any chest pain or palpitations at this time.  Denies any headache.  States that when she did vomit she felt like she urinated on herself.  She changed however then she continued to feel some wetness to that area but does not think she had any further urination.  The time my exam patient states she is feeling better.      Physical Exam   Triage Vital Signs: ED Triage Vitals [10/07/22 0624]  Encounter Vitals Group     BP      Systolic BP Percentile      Diastolic BP Percentile      Pulse      Resp      Temp      Temp src      SpO2      Weight 163 lb (73.9 kg)     Height 4\' 11"  (1.499 m)     Head Circumference      Peak Flow      Pain Score 0     Pain Loc      Pain Education      Exclude from Growth Chart     Most recent vital signs: Vitals:   10/07/22 0700  BP: 109/65  Pulse: 87  Resp: 19  SpO2: 100%   General: Awake, alert, oriented. CV:  Good peripheral perfusion. Regular rate and rhythm. Resp:  Normal effort. Lungs clear. Abd:  No distention. Gravid   ED Results / Procedures / Treatments   Labs (all labs ordered are listed, but only abnormal results are displayed) Labs Reviewed  COMPREHENSIVE METABOLIC PANEL - Abnormal; Notable for the following components:      Result Value   Sodium 134 (*)    Potassium 3.3 (*)    CO2 20 (*)    Calcium 8.4 (*)    Albumin 3.2 (*)    All other components within normal limits  CBC WITH DIFFERENTIAL/PLATELET -  Abnormal; Notable for the following components:   RBC 3.84 (*)    Hemoglobin 9.6 (*)    HCT 29.5 (*)    MCV 76.8 (*)    MCH 25.0 (*)    All other components within normal limits  MAGNESIUM  LIPASE, BLOOD  URINALYSIS, ROUTINE W REFLEX MICROSCOPIC     EKG  I, Phineas Semen, attending physician, personally viewed and interpreted this EKG  EKG Time: 0636 Rate: 112 Rhythm: sinus tachycardia Axis: left axis deviation Intervals: qtc 467 QRS: narrow ST changes: no st elevation Impression: abnormal ekg   RADIOLOGY None  PROCEDURES:  Critical Care performed: No    MEDICATIONS ORDERED IN ED: Medications  lactated ringers bolus 1,000 mL (1,000 mLs Intravenous New Bag/Given 10/07/22 0646)  ondansetron (ZOFRAN) injection 4 mg (4 mg Intravenous Given 10/07/22 0646)     IMPRESSION / MDM / ASSESSMENT AND PLAN / ED COURSE  I reviewed the triage vital signs and the nursing notes.  Differential diagnosis includes, but is not limited to, anemia, electrolyte abnormality, arrhythmia.  Patient's presentation is most consistent with acute presentation with potential threat to life or bodily function.   Patient presented to the emergency department today because of episode of dizziness and nausea.  The time of exam she states she is feeling better.  Blood work does show anemia which is very slightly worse than it was during recent blood work.  Electrolytes with slight hypokalemia but no signs of significant dehydration.  EKG with tachycardia but no concerning arrhythmia. Given reassuring blood work and EKG and clinical improvement do feel patient is safe for discharge to L and D for evaluation of discharge.      FINAL CLINICAL IMPRESSION(S) / ED DIAGNOSES   Final diagnoses:  Lightheadedness     Note:  This document was prepared using Dragon voice recognition software and may include unintentional dictation errors.    Phineas Semen, MD 10/07/22  [phone]

## 2022-10-07 NOTE — Discharge Summary (Signed)
LABOR & DELIVERY OB TRIAGE NOTE  SUBJECTIVE  HPI Shelley Nichols is a 22 y.o. G3P2002 at [redacted]w[redacted]d who presents to Labor & Delivery for increased vaginal discharge. She was seen and cleared medically in the ED for dizziness, nausea, vomiting. She reports symptoms have now resolved. With vomiting she had an episode of incontinence then had wetness on underwear and is unsure if fluid is just urine or vaginal discharge/fluid. Denies vaginal bleeding. Endorses fetal movement. Denies contractions, cramping or pain.  Prenatal care received at [facility], last visit . 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 History of GDM in prior pregnancy. 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 History of shoulder dystocia in first pregnancy.  OB History     Gravida  3   Para  2   Term  2   Preterm  0   AB  0   Living  2      SAB  0   IAB  0   Ectopic  0   Multiple  0   Live Births  2        Obstetric Comments  1st Menstrual Cycle:  11 1st Pregnancy:  NA            OBJECTIVE  BP 106/68 (BP Location: Left Arm)   Pulse 81   Temp 98.3 F (36.8 C) (Oral)   Resp 16   Ht 4\' 11"  (1.499 m)   Wt 73.9 kg   SpO2 100%   BMI 32.92 kg/m   Physical Exam Vitals and nursing note reviewed. Exam conducted with a chaperone present.  Constitutional:      Appearance: Normal appearance.  Cardiovascular:     Rate and Rhythm: Normal rate.  Pulmonary:     Effort: Pulmonary effort is normal.  Abdominal:     Palpations: Abdomen is soft.     Tenderness: There is no abdominal tenderness.     Comments: gravid  Genitourinary:    General: Normal vulva.     Comments: SSE completed, vaginal walls prolapsing with exam, no pooling with valsalva, cervix posterior. White discharge in vault, no bleeding, without odor. SVE deferred. Wet prep & ROM Plus collected. Skin:    General: Skin is warm and dry.  Neurological:     General: No focal deficit present.     Mental Status: She is alert and  oriented to person, place, and time.  Psychiatric:        Mood and Affect: Mood normal.        Behavior: Behavior normal.     NST I reviewed the NST and it was reactive and appropriate for gestational age  Baseline: 130 Variability: moderate Accelerations: 10x10 present Decelerations:none Toco: no contractions noted Category I  ASSESSMENT Impression  1) Pregnancy at W7P7106, [redacted]w[redacted]d, Estimated Date of Delivery: 12/18/22 2) Reassuring maternal/fetal status 3) Bacterial vaginosis in pregnancy 4) Dizziness-resolved  PLAN Discharge home with preterm labor precautions. Metrogel to pharmacy of choice. Increased po hydration encouraged. Maintain next scheduled prenatal visit.  Dominica Severin, CNM 10/07/22  9:18 AM

## 2022-10-07 NOTE — Progress Notes (Signed)
Discharge instructions provided to patient. Patient verbalized understanding. Pt educated on signs and symptoms of labor, vaginal bleeding, LOF, and fetal movement. Red flag signs reviewed by RN. Patient discharged home with her children in stable condition.

## 2022-10-07 NOTE — Discharge Instructions (Signed)
Please seek medical attention for any high fevers, chest pain, shortness of breath, change in behavior, persistent vomiting, bloody stool or any other new or concerning symptoms.  

## 2024-01-27 ENCOUNTER — Ambulatory Visit: Payer: Self-pay

## 2024-02-02 ENCOUNTER — Other Ambulatory Visit (HOSPITAL_COMMUNITY)
Admission: RE | Admit: 2024-02-02 | Discharge: 2024-02-02 | Disposition: A | Discharge status: Home / Self Care | Source: Ambulatory Visit | Attending: Nurse Practitioner | Admitting: Nurse Practitioner

## 2024-02-02 ENCOUNTER — Encounter: Payer: Self-pay | Admitting: Nurse Practitioner

## 2024-02-02 ENCOUNTER — Ambulatory Visit: Admitting: Nurse Practitioner

## 2024-02-02 VITALS — BP 122/74 | HR 78 | Ht 59.0 in | Wt 162.0 lb

## 2024-02-02 DIAGNOSIS — F32A Depression, unspecified: Secondary | ICD-10-CM

## 2024-02-02 DIAGNOSIS — Z113 Encounter for screening for infections with a predominantly sexual mode of transmission: Secondary | ICD-10-CM | POA: Diagnosis present

## 2024-02-02 DIAGNOSIS — Z3A01 Less than 8 weeks gestation of pregnancy: Secondary | ICD-10-CM | POA: Diagnosis not present

## 2024-02-02 DIAGNOSIS — Z8632 Personal history of gestational diabetes: Secondary | ICD-10-CM

## 2024-02-02 DIAGNOSIS — D649 Anemia, unspecified: Secondary | ICD-10-CM | POA: Diagnosis not present

## 2024-02-02 DIAGNOSIS — O98711 Human immunodeficiency virus [HIV] disease complicating pregnancy, first trimester: Secondary | ICD-10-CM

## 2024-02-02 DIAGNOSIS — F419 Anxiety disorder, unspecified: Secondary | ICD-10-CM

## 2024-02-02 DIAGNOSIS — Z21 Asymptomatic human immunodeficiency virus [HIV] infection status: Secondary | ICD-10-CM

## 2024-02-02 LAB — CBC WITH DIFFERENTIAL/PLATELET
Basophils Absolute: 0 K/uL (ref 0.0–0.1)
Basophils Relative: 0.2 % (ref 0.0–3.0)
Eosinophils Absolute: 0 K/uL (ref 0.0–0.7)
Eosinophils Relative: 1.1 % (ref 0.0–5.0)
HCT: 36.1 % (ref 36.0–46.0)
Hemoglobin: 11.5 g/dL — ABNORMAL LOW (ref 12.0–15.0)
Lymphocytes Relative: 22 % (ref 12.0–46.0)
Lymphs Abs: 1 K/uL (ref 0.7–4.0)
MCHC: 31.9 g/dL (ref 30.0–36.0)
MCV: 69.1 fl — ABNORMAL LOW (ref 78.0–100.0)
Monocytes Absolute: 0.3 K/uL (ref 0.1–1.0)
Monocytes Relative: 5.9 % (ref 3.0–12.0)
Neutro Abs: 3.3 K/uL (ref 1.4–7.7)
Neutrophils Relative %: 70.8 % (ref 43.0–77.0)
Platelets: 191 K/uL (ref 150.0–400.0)
RBC: 5.23 Mil/uL — ABNORMAL HIGH (ref 3.87–5.11)
RDW: 19.5 % — ABNORMAL HIGH (ref 11.5–15.5)
WBC: 4.6 K/uL (ref 4.0–10.5)

## 2024-02-02 LAB — COMPREHENSIVE METABOLIC PANEL WITH GFR
ALT: 128 U/L — ABNORMAL HIGH (ref 3–35)
AST: 71 U/L — ABNORMAL HIGH (ref 5–37)
Albumin: 4.6 g/dL (ref 3.5–5.2)
Alkaline Phosphatase: 60 U/L (ref 39–117)
BUN: 9 mg/dL (ref 6–23)
CO2: 23 meq/L (ref 19–32)
Calcium: 8.9 mg/dL (ref 8.4–10.5)
Chloride: 104 meq/L (ref 96–112)
Creatinine, Ser: 0.59 mg/dL (ref 0.40–1.20)
GFR: 125.71 mL/min
Glucose, Bld: 87 mg/dL (ref 70–99)
Potassium: 3.3 meq/L — ABNORMAL LOW (ref 3.5–5.1)
Sodium: 136 meq/L (ref 135–145)
Total Bilirubin: 0.5 mg/dL (ref 0.2–1.2)
Total Protein: 8.2 g/dL (ref 6.0–8.3)

## 2024-02-02 LAB — VITAMIN B12: Vitamin B-12: 405 pg/mL (ref 211–911)

## 2024-02-02 LAB — HEMOGLOBIN A1C: Hgb A1c MFr Bld: 5.4 % (ref 4.6–6.5)

## 2024-02-02 LAB — URINE CYTOLOGY ANCILLARY ONLY
Chlamydia: NEGATIVE
Comment: NEGATIVE
Comment: NORMAL
Neisseria Gonorrhea: NEGATIVE

## 2024-02-02 LAB — IRON: Iron: 214 ug/dL — ABNORMAL HIGH (ref 42–145)

## 2024-02-02 LAB — HCG, QUANTITATIVE, PREGNANCY: Quantitative HCG: 2879 m[IU]/mL

## 2024-02-02 LAB — FERRITIN: Ferritin: 31.9 ng/mL (ref 10.0–291.0)

## 2024-02-02 MED ORDER — PRENATAL PLUS VITAMIN/MINERAL 27-1 MG PO TABS: 1.0000 | ORAL_TABLET | Freq: Every day | ORAL | 3 refills | Status: AC

## 2024-02-02 NOTE — Assessment & Plan Note (Signed)
 She was diagnosed at age 23. She is on Biktarvy as of positive pregnancy test and follows with an infectious disease provider. No recent blood work is available. Continue Biktarvy. Check CMP, CBC today. Advised follow-up with infectious disease provider.

## 2024-02-02 NOTE — Assessment & Plan Note (Signed)
 Chronic, ongoing. She reports symptom improvement but has concerns about medications. No current suicidal ideation. Discussed non-pharmacological interventions including exercise, meditation, and journaling. Provided information on behavioral health urgent care for acute needs. Follow-up in 3 months.

## 2024-02-02 NOTE — Assessment & Plan Note (Signed)
 Last menstrual period on November 16th. She has a history of gestational diabetes and postpartum hemorrhage in previous pregnancies. Check HCG today per request. Prescribed prenatal vitamins to take 1 tablet daily. Advised discussing previous pregnancy complications with OB/GYN.

## 2024-02-02 NOTE — Assessment & Plan Note (Signed)
 Check A1c today.

## 2024-02-02 NOTE — Progress Notes (Signed)
 "  New Patient Visit  BP 122/74 (BP Location: Left Arm, Patient Position: Sitting, Cuff Size: Normal)   Pulse 78   Ht 4' 11 (1.499 m)   Wt 162 lb (73.5 kg)   LMP 12/27/2023 (Exact Date)   SpO2 98%   BMI 32.72 kg/m    Subjective:    Patient ID: Shelley Nichols, female    DOB: 08-22-2000, 23 y.o.   MRN: [id number]  CC: Chief Complaint  Patient presents with   Establish Care    NP. Est. Care, wants to confirm pregnancy test by blood work    HPI: Shelley Nichols is a 23 y.o. female presents for new patient visit to establish care.  Introduced to publishing rights manager role and practice setting.  All questions answered.  Discussed provider/patient relationship and expectations.  Discussed the use of AI scribe software for clinical note transcription with the patient, who gave verbal consent to proceed.  History of Present Illness   Shelley Nichols is a 23 year old female with HIV who presents to establish care and for confirmation of pregnancy.  She is pregnant with LMP on November 16 and brief spotting on November 26-27. A home pregnancy test was positive. She has nausea, frequent urination, and difficulty sleeping, without breast tenderness.  She has HIV and recently restarted Biktarvy after a period of non-compliance related to depression and anxiety. She follows with an infectious disease provider in [place]. She has not started a prenatal vitamin due to cost but is taking an iron supplement. She has low blood count and had gestational diabetes with her first child and monitoring in later pregnancies. She had hemorrhage with all prior pregnancies.  She has depression and anxiety since the birth of her last child and is not taking medication or in therapy. She reports some improvement and more energy with her children and denies thoughts of self-harm.  She does not smoke, vape, or drink alcohol.        02/02/2024   11:07 AM  Depression screen PHQ 2/9   Decreased Interest 2  Down, Depressed, Hopeless 1  PHQ - 2 Score 3  Altered sleeping 3  Tired, decreased energy 2  Change in appetite 3  Feeling bad or failure about yourself  3  Trouble concentrating 2  Moving slowly or fidgety/restless 1  Suicidal thoughts 0  PHQ-9 Score 17  Difficult doing work/chores Somewhat difficult      02/02/2024   11:08 AM  GAD 7 : Generalized Anxiety Score  Nervous, Anxious, on Edge 3  Control/stop worrying 1  Worry too much - different things 0  Trouble relaxing 0  Restless 1  Easily annoyed or irritable 2  Afraid - awful might happen 3  Total GAD 7 Score 10  Anxiety Difficulty Somewhat difficult    Past Medical History:  Diagnosis Date   Abscess of leg    Abscess of neck    Anxiety    Cellulitis and abscess of buttock    Depression    Fibroadenoma of breast    Gestational diabetes    History of MRSA infection    HIV infection (HCC)    diagnosed at age 60-3   Scoliosis     Past Surgical History:  Procedure Laterality Date   INCISION AND DRAINAGE ABSCESS  2012?   neck and leg - unsure of dates    Family History  Adopted: Yes  Problem Relation Age of Onset   HIV/AIDS Mother  HIV/AIDS Father      Social History[1]  Medications Ordered Prior to Encounter[2]   Review of Systems  Constitutional: Negative.   HENT: Negative.    Respiratory: Negative.    Cardiovascular: Negative.   Gastrointestinal:  Positive for nausea. Negative for abdominal pain and constipation.  Genitourinary:  Positive for frequency. Negative for dysuria.  Musculoskeletal: Negative.   Skin: Negative.   Neurological: Negative.   Psychiatric/Behavioral:  Positive for dysphoric mood and sleep disturbance.       Objective:    BP 122/74 (BP Location: Left Arm, Patient Position: Sitting, Cuff Size: Normal)   Pulse 78   Ht 4' 11 (1.499 m)   Wt 162 lb (73.5 kg)   LMP 12/27/2023 (Exact Date)   SpO2 98%   BMI 32.72 kg/m   Wt Readings from Last 3  Encounters:  02/02/24 162 lb (73.5 kg)  10/07/22 163 lb (73.9 kg)  07/08/22 165 lb (74.8 kg)    BP Readings from Last 3 Encounters:  02/02/24 122/74  10/07/22 106/68  07/08/22 128/83    Physical Exam Vitals and nursing note reviewed.  Constitutional:      General: She is not in acute distress.    Appearance: Normal appearance.  HENT:     Head: Normocephalic and atraumatic.     Right Ear: Tympanic membrane, ear canal and external ear normal.     Left Ear: Tympanic membrane, ear canal and external ear normal.  Eyes:     Conjunctiva/sclera: Conjunctivae normal.  Cardiovascular:     Rate and Rhythm: Normal rate and regular rhythm.     Pulses: Normal pulses.     Heart sounds: Normal heart sounds.  Pulmonary:     Effort: Pulmonary effort is normal.     Breath sounds: Normal breath sounds.  Abdominal:     Palpations: Abdomen is soft.     Tenderness: There is no abdominal tenderness.  Musculoskeletal:        General: Normal range of motion.     Cervical back: Normal range of motion and neck supple.     Right lower leg: No edema.     Left lower leg: No edema.  Lymphadenopathy:     Cervical: No cervical adenopathy.  Skin:    General: Skin is warm and dry.  Neurological:     General: No focal deficit present.     Mental Status: She is alert and oriented to person, place, and time.     Cranial Nerves: No cranial nerve deficit.     Coordination: Coordination normal.     Gait: Gait normal.  Psychiatric:        Mood and Affect: Mood normal.        Behavior: Behavior normal.        Thought Content: Thought content normal.        Judgment: Judgment normal.       Assessment & Plan:   Problem List Items Addressed This Visit       Other   Asymptomatic HIV infection, with no history of HIV-related illness (HCC)   She was diagnosed at age 11. She is on Biktarvy as of positive pregnancy test and follows with an infectious disease provider. No recent blood work is available.  Continue Biktarvy. Check CMP, CBC today. Advised follow-up with infectious disease provider.      Relevant Medications   BIKTARVY 50-200-25 MG TABS tablet   Other Relevant Orders   CBC with Differential/Platelet   Comprehensive metabolic panel with  GFR   Anxiety and depression   Chronic, ongoing. She reports symptom improvement but has concerns about medications. No current suicidal ideation. Discussed non-pharmacological interventions including exercise, meditation, and journaling. Provided information on behavioral health urgent care for acute needs. Follow-up in 3 months.       Anemia   Check CBC, iron, ferritin, B12 and treat based on results.       Relevant Medications   FERROUS SULFATE PO   Other Relevant Orders   CBC with Differential/Platelet   Vitamin B12   Iron   Ferritin   History of gestational diabetes   Check A1c today.       Relevant Orders   Hemoglobin A1c   Less than [redacted] weeks gestation of pregnancy - Primary   Last menstrual period on November 16th. She has a history of gestational diabetes and postpartum hemorrhage in previous pregnancies. Check HCG today per request. Prescribed prenatal vitamins to take 1 tablet daily. Advised discussing previous pregnancy complications with OB/GYN.       Relevant Orders   B-HCG Quant   Other Visit Diagnoses       Screen for STD (sexually transmitted disease)       Screen STD today   Relevant Orders   Urine cytology ancillary only   Hepatitis C antibody        Follow up plan: Return in about 3 months (around 05/02/2024) for CPE.  Amaziah Ghosh A Leroy Pettway    [1]  Social History Tobacco Use   Smoking status: Never   Smokeless tobacco: Never  Vaping Use   Vaping status: Never Used  Substance Use Topics   Alcohol use: No   Drug use: No  [2]  Current Outpatient Medications on File Prior to Visit  Medication Sig Dispense Refill   BIKTARVY 50-200-25 MG TABS tablet Take 1 tablet by mouth daily.      butalbital -acetaminophen-caffeine  (FIORICET) 50-325-40 MG tablet 1-2 tablets every 6 hours as needed for headache 20 tablet 0   FERROUS SULFATE PO Take by mouth daily.     promethazine  (PHENERGAN ) 12.5 MG tablet Take 1 tablet (12.5 mg total) by mouth every 6 (six) hours as needed for nausea or vomiting. (Patient not taking: Reported on 02/02/2024) 30 tablet 0   No current facility-administered medications on file prior to visit.   "

## 2024-02-02 NOTE — Patient Instructions (Signed)
 It was great to see you!  We are checking your labs today and will let you know the results via mychart/phone.   Start a prenatal vitamin daily   [facility] is open 24/7 and is a walk-in urgent care  [facility] [address], [place], [place] [address] [phone]  Let's follow-up in 3 months, sooner if you have concerns.  If a referral was placed today, you will be contacted for an appointment. Please note that routine referrals can sometimes take up to 3-4 weeks to process. Please call our office if you haven't heard anything after this time frame.  Take care,  Tinnie Harada, NP

## 2024-02-02 NOTE — Assessment & Plan Note (Signed)
 Check CBC, iron, ferritin, B12 and treat based on results.

## 2024-02-03 LAB — HEPATITIS C ANTIBODY: Hepatitis C Ab: NONREACTIVE

## 2024-02-05 ENCOUNTER — Ambulatory Visit: Payer: Self-pay | Admitting: Nurse Practitioner

## 2024-02-05 DIAGNOSIS — Z3A01 Less than 8 weeks gestation of pregnancy: Secondary | ICD-10-CM

## 2024-02-05 DIAGNOSIS — R7989 Other specified abnormal findings of blood chemistry: Secondary | ICD-10-CM

## 2024-02-18 NOTE — Telephone Encounter (Signed)
 I called and spoke with patient and she is ok with being seen tomorrow at 2:20pm.

## 2024-02-18 NOTE — Telephone Encounter (Signed)
 Appointment scheduled.

## 2024-02-19 ENCOUNTER — Ambulatory Visit: Admitting: Nurse Practitioner

## 2024-02-19 ENCOUNTER — Encounter: Payer: Self-pay | Admitting: Nurse Practitioner

## 2024-02-23 ENCOUNTER — Other Ambulatory Visit

## 2024-03-03 ENCOUNTER — Ambulatory Visit: Payer: Self-pay | Admitting: Nurse Practitioner

## 2024-03-03 ENCOUNTER — Ambulatory Visit
Admission: RE | Admit: 2024-03-03 | Discharge: 2024-03-03 | Disposition: A | Discharge status: Home / Self Care | Source: Ambulatory Visit | Attending: Nurse Practitioner | Admitting: Nurse Practitioner

## 2024-03-03 DIAGNOSIS — R7989 Other specified abnormal findings of blood chemistry: Secondary | ICD-10-CM

## 2024-03-11 ENCOUNTER — Ambulatory Visit: Payer: Self-pay

## 2024-05-03 ENCOUNTER — Encounter: Admitting: Nurse Practitioner
